# Patient Record
Sex: Female | Born: 1955 | Race: White | Hispanic: No | Marital: Married | Smoking: Never smoker
Health system: Southern US, Community
[De-identification: ages and names within clinical notes are randomized; demographics above are authoritative.]

## PROBLEM LIST (undated history)

## (undated) DIAGNOSIS — I10 Essential (primary) hypertension: Secondary | ICD-10-CM

## (undated) HISTORY — PX: COSMETIC SURGERY: SHX468

---

## 2019-11-04 ENCOUNTER — Other Ambulatory Visit: Payer: Self-pay

## 2019-11-04 ENCOUNTER — Emergency Department (HOSPITAL_COMMUNITY): Payer: 59

## 2019-11-04 ENCOUNTER — Encounter (HOSPITAL_COMMUNITY): Payer: Self-pay

## 2019-11-04 ENCOUNTER — Emergency Department (HOSPITAL_COMMUNITY)
Admission: EM | Admit: 2019-11-04 | Discharge: 2019-11-04 | Disposition: A | Discharge status: Home / Self Care | Payer: 59 | Attending: Emergency Medicine | Admitting: Emergency Medicine

## 2019-11-04 DIAGNOSIS — I10 Essential (primary) hypertension: Secondary | ICD-10-CM | POA: Diagnosis not present

## 2019-11-04 DIAGNOSIS — R0789 Other chest pain: Secondary | ICD-10-CM | POA: Diagnosis not present

## 2019-11-04 DIAGNOSIS — R072 Precordial pain: Secondary | ICD-10-CM | POA: Diagnosis present

## 2019-11-04 HISTORY — DX: Essential (primary) hypertension: I10

## 2019-11-04 LAB — BASIC METABOLIC PANEL
Anion gap: 12 (ref 5–15)
BUN: 14 mg/dL (ref 8–23)
CO2: 22 mmol/L (ref 22–32)
Calcium: 9.3 mg/dL (ref 8.9–10.3)
Chloride: 107 mmol/L (ref 98–111)
Creatinine, Ser: 0.63 mg/dL (ref 0.44–1.00)
GFR, Estimated: >60 mL/min (ref >60)
Glucose, Bld: 106 mg/dL — ABNORMAL HIGH (ref 70–99)
Potassium: 3.8 mmol/L (ref 3.5–5.1)
Sodium: 141 mmol/L (ref 135–145)

## 2019-11-04 LAB — HEPATIC FUNCTION PANEL
ALT: 17 U/L (ref 0–44)
AST: 17 U/L (ref 15–41)
Albumin: 5 g/dL (ref 3.5–5.0)
Alkaline Phosphatase: 65 U/L (ref 38–126)
Bilirubin, Direct: 0.1 mg/dL (ref 0.0–0.2)
Indirect Bilirubin: 0.8 mg/dL (ref 0.3–0.9)
Total Bilirubin: 0.9 mg/dL (ref 0.3–1.2)
Total Protein: 8.2 g/dL — ABNORMAL HIGH (ref 6.5–8.1)

## 2019-11-04 LAB — CBC
HCT: 42.5 % (ref 36.0–46.0)
Hemoglobin: 14.4 g/dL (ref 12.0–15.0)
MCH: 30.2 pg (ref 26.0–34.0)
MCHC: 33.9 g/dL (ref 30.0–36.0)
MCV: 89.1 fL (ref 80.0–100.0)
Platelets: 323 10*3/uL (ref 150–400)
RBC: 4.77 MIL/uL (ref 3.87–5.11)
RDW: 12.2 % (ref 11.5–15.5)
WBC: 12.6 10*3/uL — ABNORMAL HIGH (ref 4.0–10.5)
nRBC: 0 % (ref 0.0–0.2)

## 2019-11-04 LAB — TROPONIN I (HIGH SENSITIVITY)
Troponin I (High Sensitivity): 3 ng/L (ref <18)
Troponin I (High Sensitivity): 6 ng/L (ref <18)

## 2019-11-04 NOTE — ED Provider Notes (Signed)
[facility]-EMERGENCY DEPT Provider Note   CSN: [id number] Arrival date & time: 11/04/19  1628     History Chief Complaint  Patient presents with  . Chest Pain    Colleen Chan is a 64 y.o. female.  Patient states that she had some midsternal chest pain only lasting for a few minutes today and she was seen by her doctor suggested she come over here for evaluation  The history is provided by the patient and medical records. No language interpreter was used.  Chest Pain Pain location:  Substernal area Pain quality: aching   Pain radiates to:  Does not radiate Pain severity:  Moderate Onset quality:  Sudden Timing:  Constant Progression:  Worsening Chronicity:  New Context: not breathing   Associated symptoms: no abdominal pain, no back pain, no cough, no fatigue and no headache        Past Medical History:  Diagnosis Date  . Hypertension     There are no problems to display for this patient.   Past Surgical History:  Procedure Laterality Date  . COSMETIC SURGERY       OB History   No obstetric history on file.     Family History  Problem Relation Age of Onset  . COPD Mother     Social History   Tobacco Use  . Smoking status: Never Smoker  . Smokeless tobacco: Never Used  Vaping Use  . Vaping Use: Never used  Substance Use Topics  . Alcohol use: Never  . Drug use: Never    Home Medications Prior to Admission medications   Not on File    Allergies    Patient has no known allergies.  Review of Systems   Review of Systems  Constitutional: Negative for appetite change and fatigue.  HENT: Negative for congestion, ear discharge and sinus pressure.   Eyes: Negative for discharge.  Respiratory: Negative for cough.   Cardiovascular: Positive for chest pain.  Gastrointestinal: Negative for abdominal pain and diarrhea.  Genitourinary: Negative for frequency and hematuria.  Musculoskeletal: Negative for back pain.  Skin:  Negative for rash.  Neurological: Negative for seizures and headaches.  Psychiatric/Behavioral: Negative for hallucinations.    Physical Exam Updated Vital Signs BP (!) 163/90   Pulse 79   Temp 97.7 F (36.5 C) (Oral)   Resp 18   Ht 5\' 2"  (1.575 m)   Wt 80.3 kg   SpO2 96%   BMI 32.37 kg/m   Physical Exam Vitals and nursing note reviewed.  Constitutional:      Appearance: She is well-developed.  HENT:     Head: Normocephalic.     Nose: Nose normal.  Eyes:     General: No scleral icterus.    Conjunctiva/sclera: Conjunctivae normal.  Neck:     Thyroid: No thyromegaly.  Cardiovascular:     Rate and Rhythm: Normal rate and regular rhythm.     Heart sounds: No murmur heard.  No friction rub. No gallop.   Pulmonary:     Breath sounds: No stridor. No wheezing or rales.  Chest:     Chest wall: No tenderness.  Abdominal:     General: There is no distension.     Tenderness: There is no abdominal tenderness. There is no rebound.  Musculoskeletal:        General: Normal range of motion.     Cervical back: Neck supple.  Lymphadenopathy:     Cervical: No cervical adenopathy.  Skin:  Findings: No erythema or rash.  Neurological:     Mental Status: She is alert and oriented to person, place, and time.     Motor: No abnormal muscle tone.     Coordination: Coordination normal.  Psychiatric:        Behavior: Behavior normal.     ED Results / Procedures / Treatments   Labs (all labs ordered are listed, but only abnormal results are displayed) Labs Reviewed  BASIC METABOLIC PANEL - Abnormal; Notable for the following components:      Result Value   Glucose, Bld 106 (*)    All other components within normal limits  CBC - Abnormal; Notable for the following components:   WBC 12.6 (*)    All other components within normal limits  HEPATIC FUNCTION PANEL - Abnormal; Notable for the following components:   Total Protein 8.2 (*)    All other components within normal limits    TROPONIN I (HIGH SENSITIVITY)  TROPONIN I (HIGH SENSITIVITY)    EKG None  Radiology DG Chest 2 View  Result Date: 11/04/2019 CLINICAL DATA:  Chest pain EXAM: CHEST - 2 VIEW COMPARISON:  None. FINDINGS: The heart size and mediastinal contours are within normal limits. Both lungs are clear. The visualized skeletal structures are unremarkable. IMPRESSION: Normal study. Electronically Signed   By: Charlett Nose M.D.   On: 11/04/2019 17:51    Procedures Procedures (including critical care time)  Medications Ordered in ED Medications - No data to display  ED Course  I have reviewed the triage vital signs and the nursing notes.  Pertinent labs & imaging results that were available during my care of the patient were reviewed by me and considered in my medical decision making (see chart for details).    MDM Rules/Calculators/A&P                          Patient with atypical chest pain.   Troponin x2 is negative CBC and chemistries unremarkable except for mild elevation of white count.  Chest x-ray and EKG unremarkable patient with atypical chest pain doubt coronary artery disease.  She will follow up with PCP Final Clinical Impression(s) / ED Diagnoses Final diagnoses:  Atypical chest pain    Rx / DC Orders ED Discharge Orders    None       Bethann Berkshire, MD 11/04/19 2246

## 2019-11-04 NOTE — Discharge Instructions (Addendum)
Follow up with your md next week for recheck °

## 2019-11-04 NOTE — ED Triage Notes (Signed)
Patient reports that she has been having headaches with nausea since 10/31/19 Patient also reports pain in left jaw and upper back, indigestion, and SOB the following day. Patient then reports that she went to her PCP/NP and it showed an MI, but was scheduled for an echo,  Patient states when she has intermittent mid chest pain that she describes "as a stick", but denies any nausea or SOB.

## 2021-08-29 IMAGING — CR DG CHEST 2V
2 series · 2 of 2 positions shown · non-contrast
Comparison: None.

CLINICAL DATA: Chest pain

EXAM:
CHEST - 2 VIEW

[w chest pa]
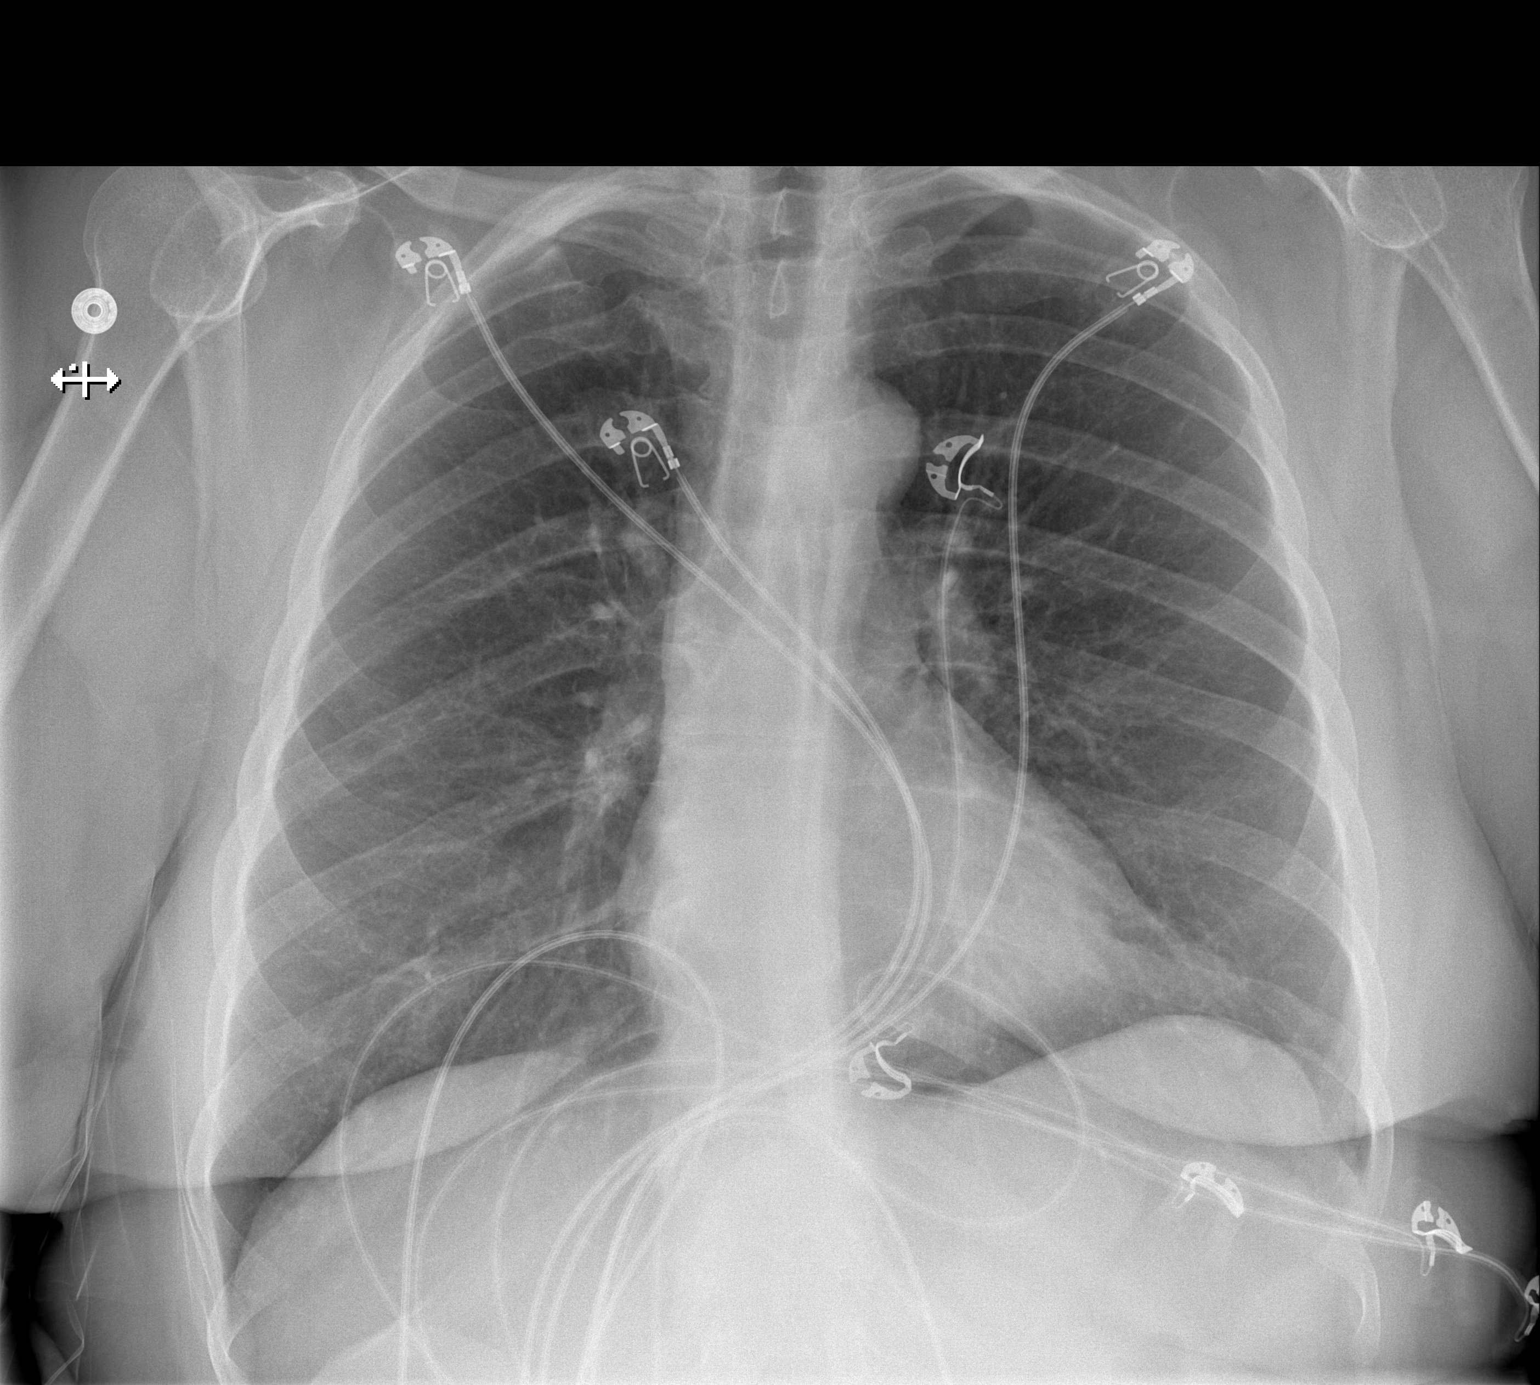

[w chest lat]
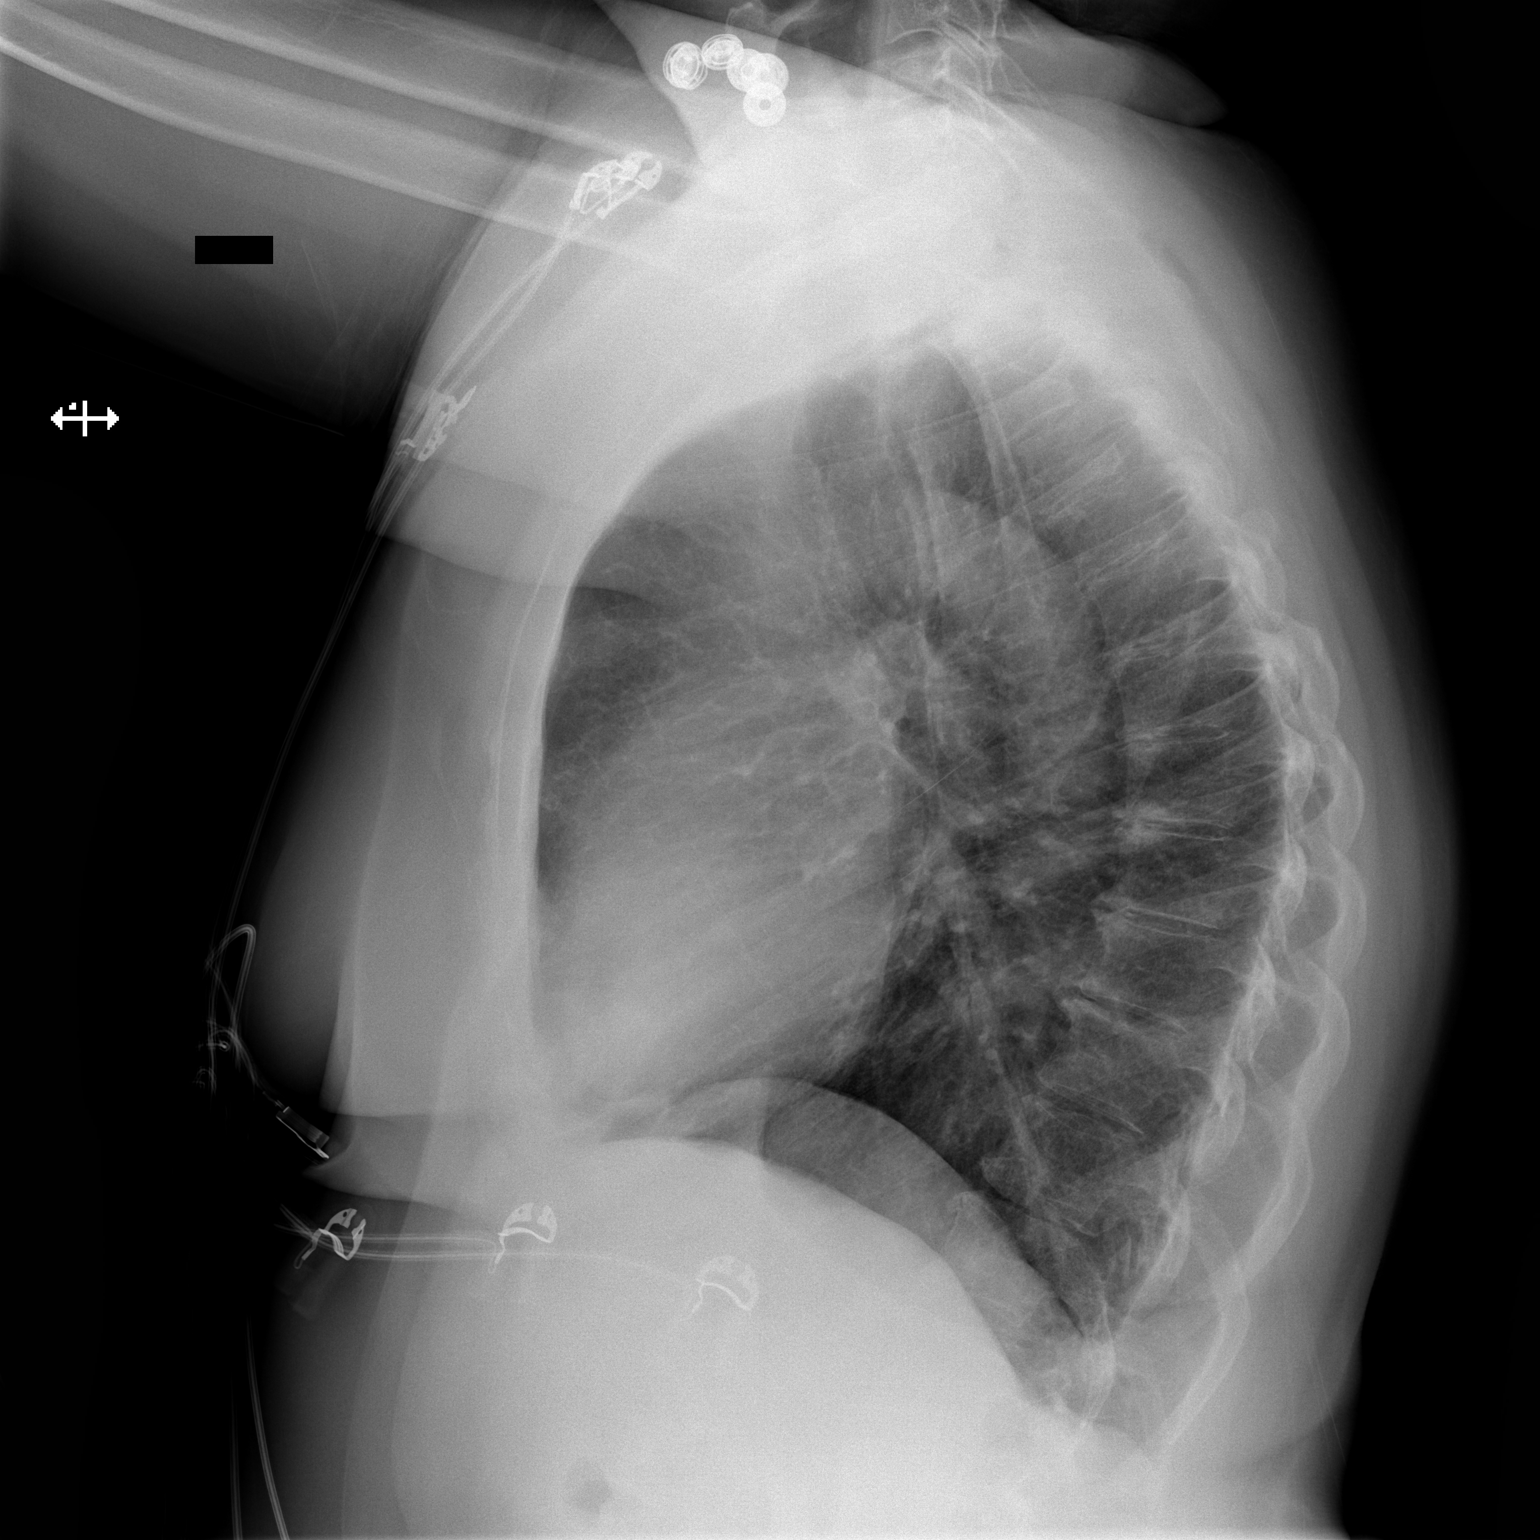

[2 of 2 positions shown; findings below may reference images not displayed]

FINDINGS: The heart size and mediastinal contours are within normal limits.
Both lungs are clear. The visualized skeletal structures are
unremarkable.
IMPRESSION: Normal study.

## 2023-07-30 ENCOUNTER — Emergency Department (HOSPITAL_COMMUNITY)
Admission: EM | Admit: 2023-07-30 | Discharge: 2023-07-30 | Disposition: A | Discharge status: Home / Self Care | Attending: Emergency Medicine | Admitting: Emergency Medicine

## 2023-07-30 DIAGNOSIS — R079 Chest pain, unspecified: Secondary | ICD-10-CM | POA: Diagnosis not present

## 2023-07-30 DIAGNOSIS — K0889 Other specified disorders of teeth and supporting structures: Secondary | ICD-10-CM | POA: Diagnosis present

## 2023-07-30 DIAGNOSIS — R202 Paresthesia of skin: Secondary | ICD-10-CM | POA: Insufficient documentation

## 2023-07-30 LAB — BASIC METABOLIC PANEL WITH GFR
Anion gap: 13 (ref 5–15)
BUN: 10 mg/dL (ref 8–23)
CO2: 21 mmol/L — ABNORMAL LOW (ref 22–32)
Calcium: 9.3 mg/dL (ref 8.9–10.3)
Chloride: 104 mmol/L (ref 98–111)
Creatinine, Ser: 0.58 mg/dL (ref 0.44–1.00)
GFR, Estimated: >60 mL/min (ref >60)
Glucose, Bld: 113 mg/dL — ABNORMAL HIGH (ref 70–99)
Potassium: 3.9 mmol/L (ref 3.5–5.1)
Sodium: 138 mmol/L (ref 135–145)

## 2023-07-30 LAB — TROPONIN I (HIGH SENSITIVITY): Troponin I (High Sensitivity): 6 ng/L (ref <18)

## 2023-07-30 LAB — CBC
HCT: 42.1 % (ref 36.0–46.0)
Hemoglobin: 14.2 g/dL (ref 12.0–15.0)
MCH: 30.7 pg (ref 26.0–34.0)
MCHC: 33.7 g/dL (ref 30.0–36.0)
MCV: 91.1 fL (ref 80.0–100.0)
Platelets: 278 K/uL (ref 150–400)
RBC: 4.62 MIL/uL (ref 3.87–5.11)
RDW: 12.3 % (ref 11.5–15.5)
WBC: 10.1 K/uL (ref 4.0–10.5)
nRBC: 0 % (ref 0.0–0.2)

## 2023-07-30 MED ORDER — AMOXICILLIN-POT CLAVULANATE 875-125 MG PO TABS
1.0000 | ORAL_TABLET | Freq: Once | ORAL | Status: AC
  Administered 2023-07-30: 1 via ORAL
  Filled 2023-07-30: qty 1

## 2023-07-30 NOTE — Discharge Instructions (Signed)
 Return for any problem.  ?

## 2023-07-30 NOTE — ED Provider Notes (Signed)
 [place] EMERGENCY DEPARTMENT AT [facility] Provider Note   CSN: [id number] Arrival date & time: 07/30/23  1302     Patient presents with: Numbness and Chest Pain   Colleen Chan is a 68 y.o. female.   68 year old female with prior medical history as detailed below presents for evaluation.  Patient complains of pain to the left lower jaw dentition.  She complains of some associated tingling discomfort of her left lower lip.  She was seen by her dentist for this complaint.  She was referred to oral surgery.  She decided to come to the ED for evaluation. Patient is concerned that she has a dental infection.  She was not given antibiotics by her dentist.    Apparently, the dentist may have been concerned about a deep tooth infection.  The history is provided by the patient.       Prior to Admission medications   Not on File    Allergies: Patient has no known allergies.    Review of Systems  All other systems reviewed and are negative.   Updated Vital Signs BP (!) 161/98 (BP Location: Left Arm)   Pulse 83   Temp 98.2 F (36.8 C) (Oral)   Resp 20   SpO2 98%   Physical Exam Vitals and nursing note reviewed.  Constitutional:      General: She is not in acute distress.    Appearance: Normal appearance. She is well-developed.  HENT:     Head: Normocephalic and atraumatic.  Eyes:     Conjunctiva/sclera: Conjunctivae normal.     Pupils: Pupils are equal, round, and reactive to light.  Cardiovascular:     Rate and Rhythm: Normal rate and regular rhythm.     Heart sounds: Normal heart sounds.  Pulmonary:     Effort: Pulmonary effort is normal. No respiratory distress.     Breath sounds: Normal breath sounds.  Abdominal:     General: There is no distension.     Palpations: Abdomen is soft.     Tenderness: There is no abdominal tenderness.  Musculoskeletal:        General: No deformity. Normal range of motion.     Cervical back: Normal range of  motion and neck supple.  Skin:    General: Skin is warm and dry.  Neurological:     General: No focal deficit present.     Mental Status: She is alert and oriented to person, place, and time.     Cranial Nerves: No cranial nerve deficit.     Motor: No weakness.     (all labs ordered are listed, but only abnormal results are displayed) Labs Reviewed  BASIC METABOLIC PANEL WITH GFR - Abnormal; Notable for the following components:      Result Value   CO2 21 (*)    Glucose, Bld 113 (*)    All other components within normal limits  CBC  TROPONIN I (HIGH SENSITIVITY)    EKG: EKG Interpretation Date/Time:  Thursday July 30 2023 14:06:19 EDT Ventricular Rate:  81 PR Interval:  182 QRS Duration:  92 QT Interval:  389 QTC Calculation: 452 R Axis:   -50  Text Interpretation: Sinus rhythm Left anterior fascicular block Abnormal R-wave progression, early transition Left ventricular hypertrophy Confirmed by Laurice Coy [phone]) on 07/30/2023 4:17:04 PM  Radiology: No results found.   Procedures   Medications Ordered in the ED  amoxicillin -clavulanate (AUGMENTIN ) 875-125 MG per tablet 1 tablet (1 tablet Oral Given 07/30/23  1655)                                    Medical Decision Making Amount and/or Complexity of Data Reviewed Labs: ordered.  Risk Prescription drug management.    Medical Screen Complete  This patient presented to the ED with complaint of dental pain.  This complaint involves an extensive number of treatment options. The initial differential diagnosis includes, but is not limited to, dental infection  This presentation is: Acute, Self-Limited, Previously Undiagnosed, Uncertain Prognosis, Complicated, Systemic Symptoms, and Threat to Life/Bodily Function  Patient is presenting with complaint of dental pain.  She reports some pain at the left lower dentition with some tingling discomfort into the left lip.  She is neurologically intact  otherwise.  Screening labs obtained are without significant abnormality.  Presentation is most consistent with likely dental infection.  Patient provided with a course of antibiotics.  She has already seen a dentist for this complaint.  She was referred to oral surgery.  Importance of close follow-up is stressed.  Strict return precautions given and understood.  Additional history obtained:  External records from outside sources obtained and reviewed including prior ED visits and prior Inpatient records.     Problem List / ED Course:  Dental pain   Disposition:  After consideration of the diagnostic results and the patients response to treatment, I feel that the patent would benefit from admission.       Final diagnoses:  Pain, dental    ED Discharge Orders     None          Laurice Maude BROCKS, MD 07/30/23 2233

## 2023-07-30 NOTE — ED Triage Notes (Signed)
 Patient in today reporting facial numbness near lip with some chest pain - reports seeing dentist for no relief.

## 2023-07-31 ENCOUNTER — Telehealth (HOSPITAL_COMMUNITY): Payer: Self-pay | Admitting: Emergency Medicine

## 2023-07-31 ENCOUNTER — Telehealth: Payer: Self-pay

## 2023-07-31 MED ORDER — AMOXICILLIN-POT CLAVULANATE 875-125 MG PO TABS: 1.0000 | ORAL_TABLET | Freq: Two times a day (BID) | ORAL | 0 refills | Status: AC

## 2023-07-31 NOTE — Telephone Encounter (Signed)
 Patient seen in the emergency department and had a dental infection.  Plans to be started on antibiotics however new antibiotic prescription was filled at the pharmacy.  Will send a prescription.

## 2023-07-31 NOTE — Telephone Encounter (Signed)
 Patient and father called to say that the medication  that Dr Laurice was to prescribe had not been received at the pharmacy. A review of the chart indicated that it was not sent. Asked Dr JONETTA Quale if he could send  a script to the denoted pharmacy on the chart. It was done and verified with the pharmacy and the patient that it arrived
